# Patient Record
Sex: Female | Born: 1970 | Hispanic: Yes | Marital: Married | Smoking: Never smoker
Health system: Southern US, Community
[De-identification: ages and names within clinical notes are randomized; demographics above are authoritative.]

## PROBLEM LIST (undated history)

## (undated) DIAGNOSIS — D649 Anemia, unspecified: Secondary | ICD-10-CM

---

## 2005-04-28 ENCOUNTER — Emergency Department: Payer: Self-pay | Admitting: Emergency Medicine

## 2009-06-20 ENCOUNTER — Emergency Department: Payer: Self-pay | Admitting: Emergency Medicine

## 2011-12-13 ENCOUNTER — Emergency Department: Payer: Self-pay | Admitting: Emergency Medicine

## 2011-12-13 LAB — COMPREHENSIVE METABOLIC PANEL
Albumin: 4 g/dL (ref 3.4–5.0)
Alkaline Phosphatase: 110 U/L (ref 50–136)
Anion Gap: 12 (ref 7–16)
Calcium, Total: 8.5 mg/dL (ref 8.5–10.1)
Creatinine: 0.52 mg/dL — ABNORMAL LOW (ref 0.60–1.30)
Glucose: 101 mg/dL — ABNORMAL HIGH (ref 65–99)
SGOT(AST): 27 U/L (ref 15–37)
SGPT (ALT): 21 U/L (ref 12–78)
Total Protein: 8.4 g/dL — ABNORMAL HIGH (ref 6.4–8.2)

## 2011-12-13 LAB — CBC
HCT: 34.3 % — ABNORMAL LOW (ref 35.0–47.0)
MCH: 26.1 pg (ref 26.0–34.0)
MCHC: 33.1 g/dL (ref 32.0–36.0)
MCV: 79 fL — ABNORMAL LOW (ref 80–100)
Platelet: 406 10*3/uL (ref 150–440)
RDW: 16.7 % — ABNORMAL HIGH (ref 11.5–14.5)
WBC: 6.4 10*3/uL (ref 3.6–11.0)

## 2011-12-13 LAB — URINALYSIS, COMPLETE
Bacteria: NONE SEEN
Bilirubin,UR: NEGATIVE
Nitrite: NEGATIVE
Ph: 5 (ref 4.5–8.0)
Protein: NEGATIVE

## 2011-12-16 ENCOUNTER — Ambulatory Visit: Payer: Self-pay | Admitting: Internal Medicine

## 2013-12-12 ENCOUNTER — Ambulatory Visit: Payer: Self-pay | Admitting: Family Medicine
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
## 2016-01-16 ENCOUNTER — Other Ambulatory Visit: Payer: Self-pay | Admitting: Family Medicine

## 2016-01-16 DIAGNOSIS — Z1231 Encounter for screening mammogram for malignant neoplasm of breast: Secondary | ICD-10-CM

## 2016-01-21 ENCOUNTER — Ambulatory Visit
Admission: RE | Admit: 2016-01-21 | Discharge: 2016-01-21 | Disposition: A | Discharge status: Home / Self Care | Payer: Managed Care, Other (non HMO) | Source: Ambulatory Visit | Attending: Family Medicine | Admitting: Family Medicine

## 2016-01-21 ENCOUNTER — Encounter (INDEPENDENT_AMBULATORY_CARE_PROVIDER_SITE_OTHER): Payer: Self-pay

## 2016-01-21 DIAGNOSIS — Z1231 Encounter for screening mammogram for malignant neoplasm of breast: Secondary | ICD-10-CM | POA: Diagnosis not present

## 2016-04-10 IMAGING — MG MAM DGTL SCRN MAM NO ORDER W/CAD
5 series · 5 of 5 positions shown · non-contrast
Comparison: Previous exam(s).

CLINICAL DATA: Screening.

EXAM:
DIGITAL SCREENING BILATERAL MAMMOGRAM WITH CAD

[R XCCL]
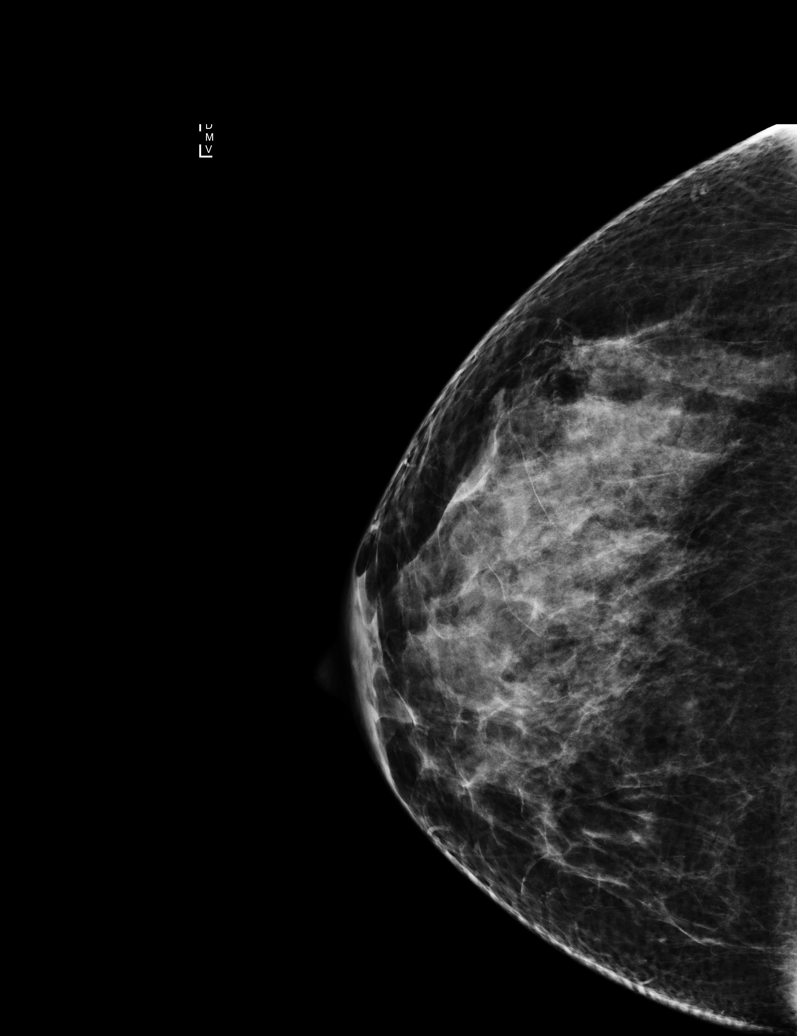

[L MLO]
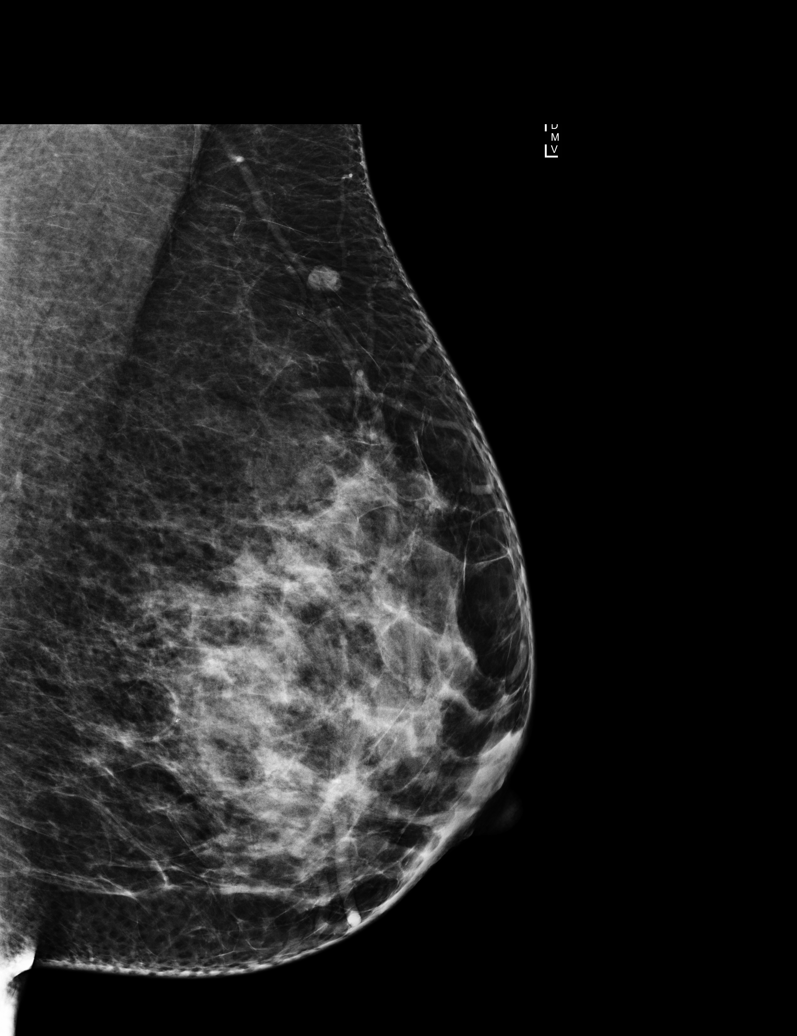

[L CC]
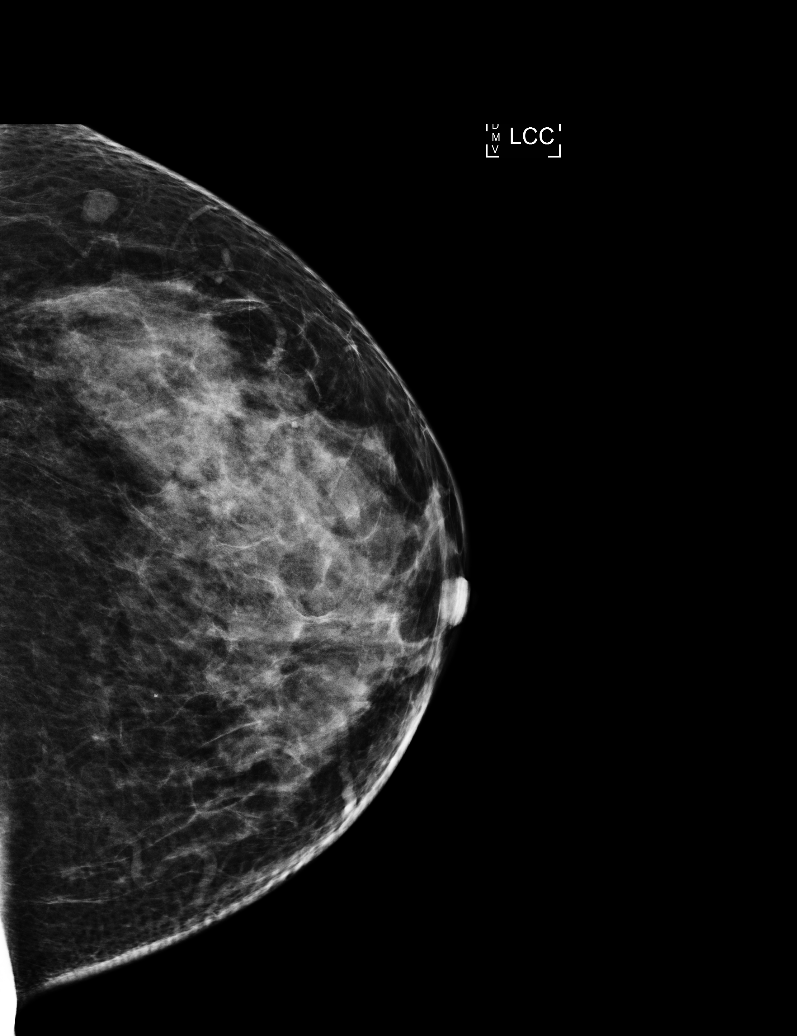

[R CC]
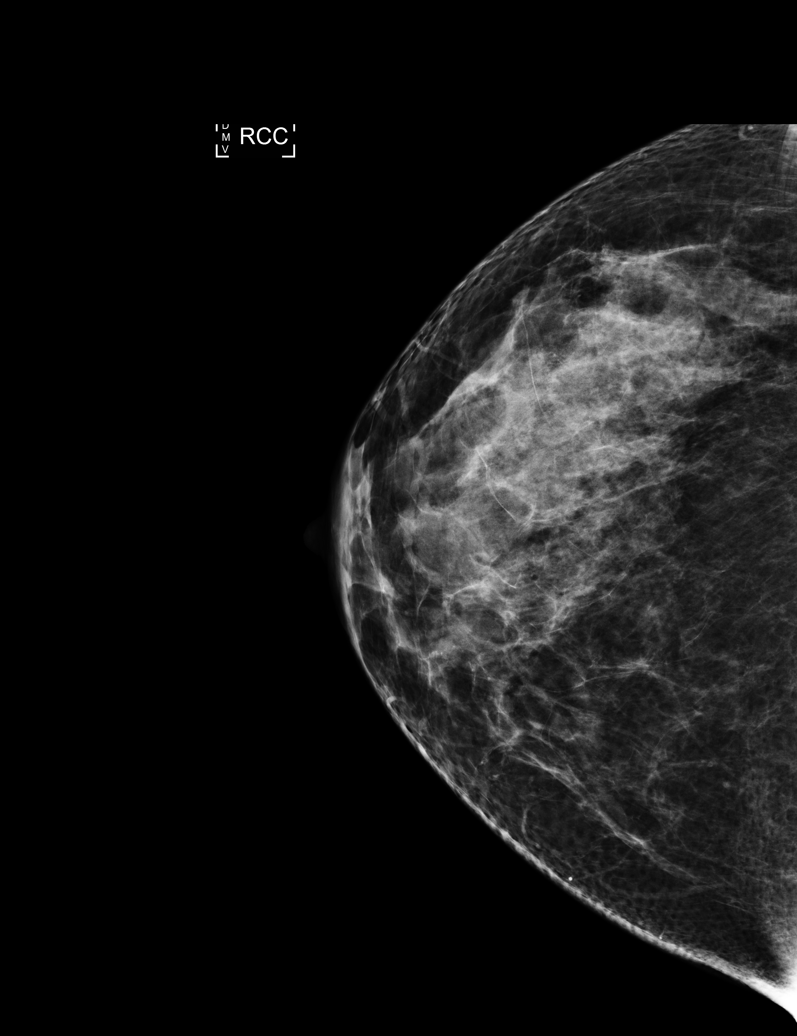

[R MLO]
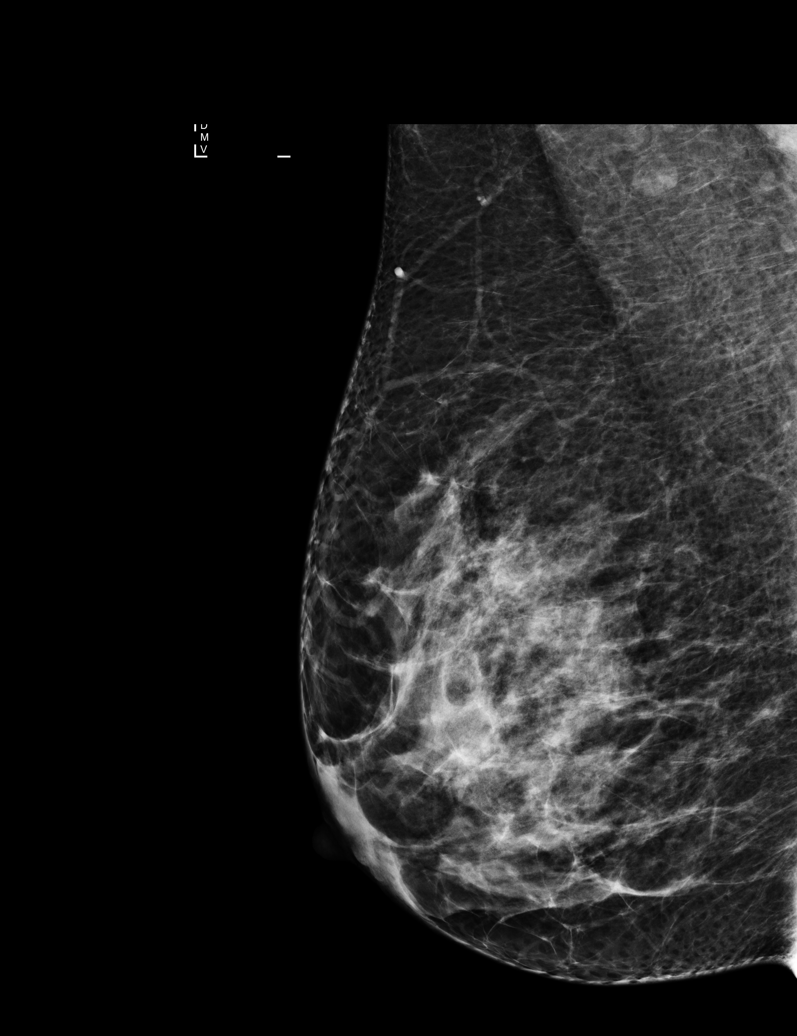

[5 of 5 positions shown; findings below may reference images not displayed]

ACR Breast Density Category c: The breast tissue is heterogeneously
dense, which may obscure small masses.
FINDINGS: There are no findings suspicious for malignancy. Images were
processed with CAD.
IMPRESSION: No mammographic evidence of malignancy. A result letter of this
screening mammogram will be mailed directly to the patient.

RECOMMENDATION:
Screening mammogram in one year. (Code:[id number])

BI-RADS CATEGORY  1: Negative.

## 2017-12-01 ENCOUNTER — Other Ambulatory Visit: Payer: Self-pay | Admitting: Family Medicine

## 2017-12-01 DIAGNOSIS — Z1231 Encounter for screening mammogram for malignant neoplasm of breast: Secondary | ICD-10-CM

## 2017-12-07 ENCOUNTER — Ambulatory Visit
Admission: RE | Admit: 2017-12-07 | Discharge: 2017-12-07 | Disposition: A | Discharge status: Home / Self Care | Payer: Commercial Managed Care - PPO | Source: Ambulatory Visit | Attending: Family Medicine | Admitting: Family Medicine

## 2017-12-07 DIAGNOSIS — Z1231 Encounter for screening mammogram for malignant neoplasm of breast: Secondary | ICD-10-CM | POA: Insufficient documentation

## 2018-10-03 ENCOUNTER — Other Ambulatory Visit: Payer: Self-pay

## 2018-10-03 DIAGNOSIS — Z20822 Contact with and (suspected) exposure to covid-19: Secondary | ICD-10-CM

## 2018-10-07 LAB — NOVEL CORONAVIRUS, NAA: SARS-CoV-2, NAA: DETECTED — AB

## 2019-05-13 ENCOUNTER — Ambulatory Visit: Payer: Commercial Managed Care - PPO | Attending: Internal Medicine

## 2019-05-13 DIAGNOSIS — Z23 Encounter for immunization: Secondary | ICD-10-CM

## 2019-05-13 NOTE — Progress Notes (Signed)
   Covid-19 Vaccination Clinic  Name:  Tracy Wright    MRN: [id number] DOB: May 14, 1970  05/13/2019  [name] was observed post Covid-19 immunization for 15 minutes without incidence. She was provided with Vaccine Information Sheet and instruction to access the V-Safe system.   [name] was instructed to call 911 with any severe reactions post vaccine: Marland Kitchen Difficulty breathing  . Swelling of your face and throat  . A fast heartbeat  . A bad rash all over your body  . Dizziness and weakness    Immunizations Administered    Name Date Dose VIS Date Route   Moderna COVID-19 Vaccine 05/13/2019  4:55 PM 0.5 mL 02/14/2019 Intramuscular   Manufacturer: Moderna   Lot: [id number]   NDC: 21194-174-08

## 2019-06-10 ENCOUNTER — Ambulatory Visit: Payer: Commercial Managed Care - PPO | Attending: Internal Medicine

## 2019-06-10 ENCOUNTER — Ambulatory Visit: Payer: Commercial Managed Care - PPO

## 2019-06-10 DIAGNOSIS — Z23 Encounter for immunization: Secondary | ICD-10-CM

## 2019-06-10 NOTE — Progress Notes (Signed)
   Covid-19 Vaccination Clinic  Name:  [name]    MRN: [id number] DOB: Mar 12, 1971  06/10/2019  [name] was observed post Covid-19 immunization for 15 minutes without incident. She was provided with Vaccine Information Sheet and instruction to access the V-Safe system.   [name] was instructed to call 911 with any severe reactions post vaccine: Marland Kitchen Difficulty breathing  . Swelling of face and throat  . A fast heartbeat  . A bad rash all over body  . Dizziness and weakness   Immunizations Administered    Name Date Dose VIS Date Route   Moderna COVID-19 Vaccine 06/10/2019  8:22 AM 0.5 mL 02/14/2019 Intramuscular   Manufacturer: Moderna   Lot: [id number]   NDC: 10272-536-64

## 2020-04-05 IMAGING — MG MM DIGITAL SCREENING BILAT W/ TOMO W/ CAD
8 series · 9 of 24 positions shown · non-contrast
Comparison: Previous exam(s).

CLINICAL DATA: Screening.

EXAM:
DIGITAL SCREENING BILATERAL MAMMOGRAM WITH TOMO AND CAD

[L CC synth-2D]
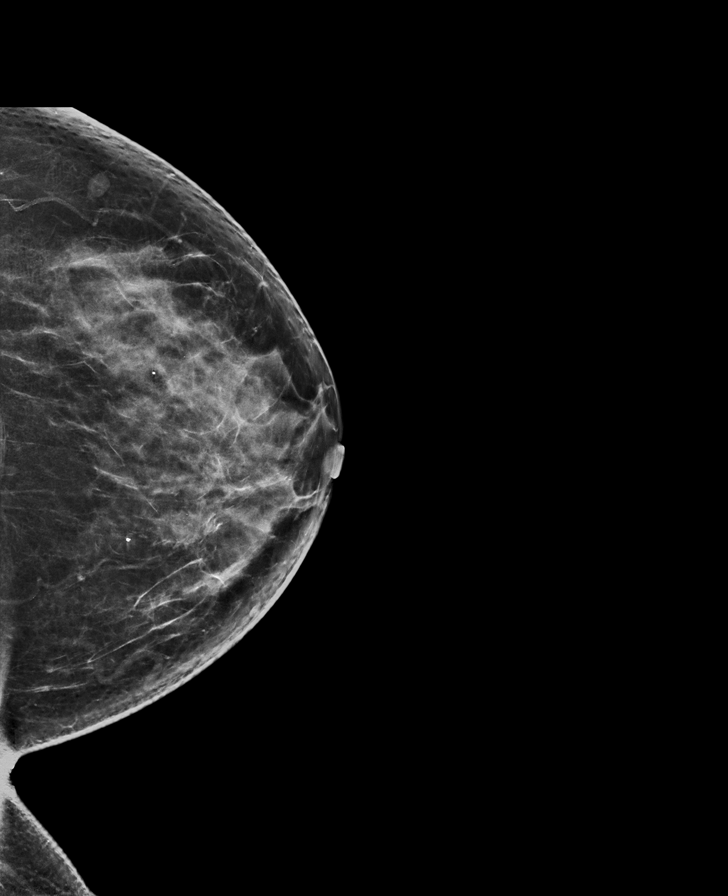

[R MLO synth-2D]
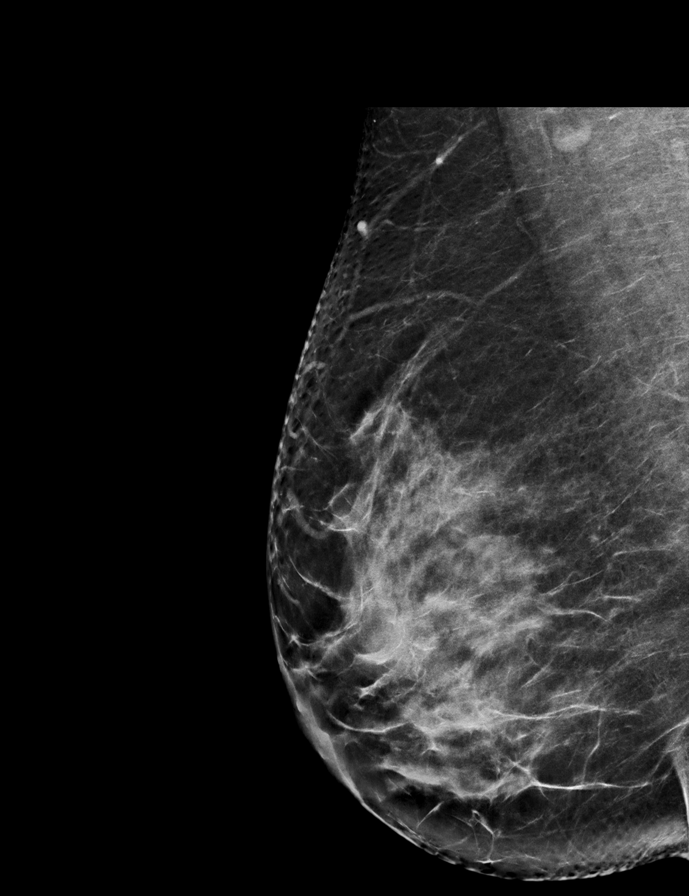

[R CC synth-2D]
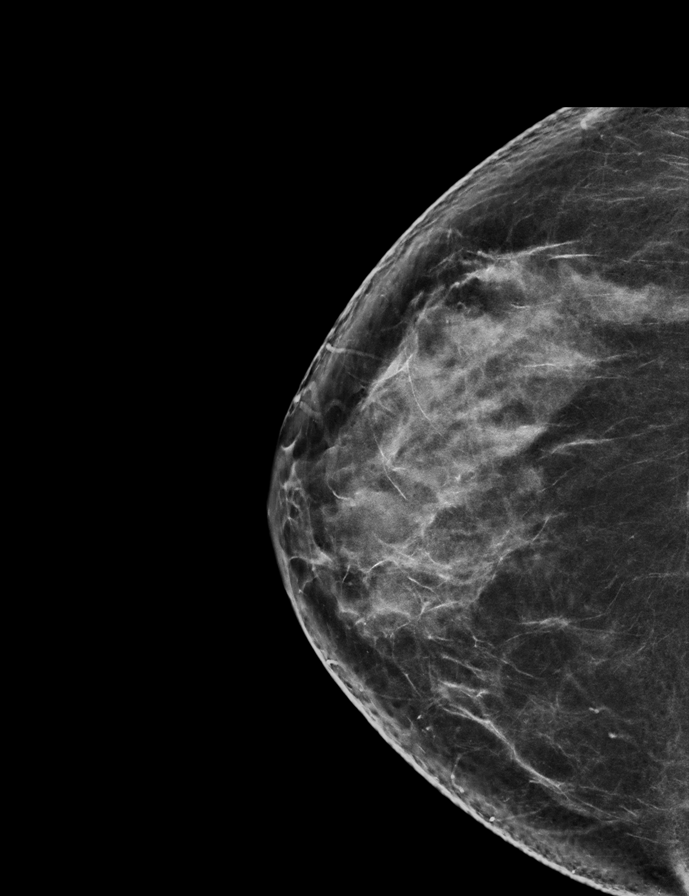

[L MLO synth-2D]
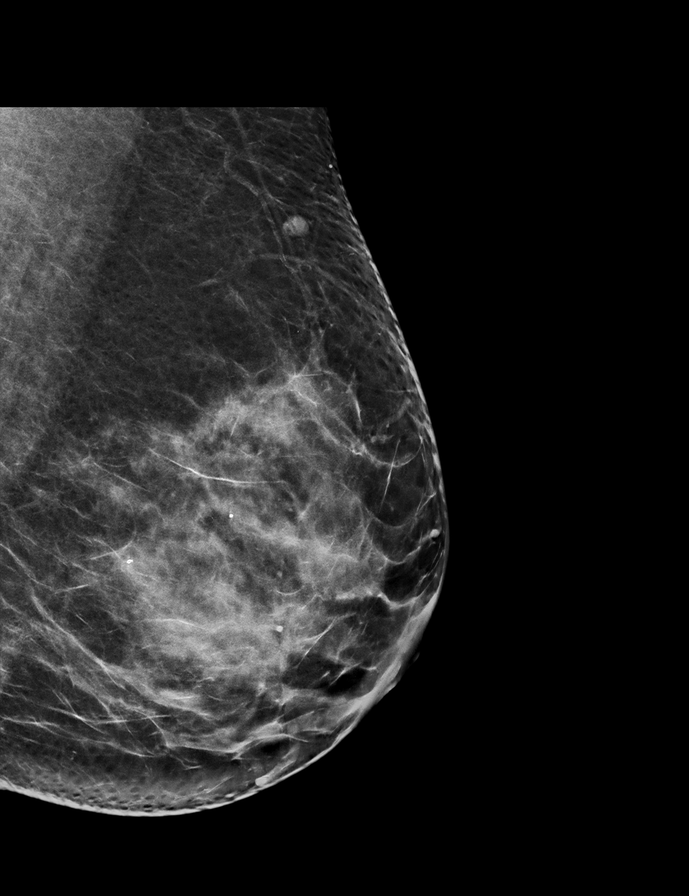

[R CC tomo · 2 of 78 frames shown]
[frame 26/78]
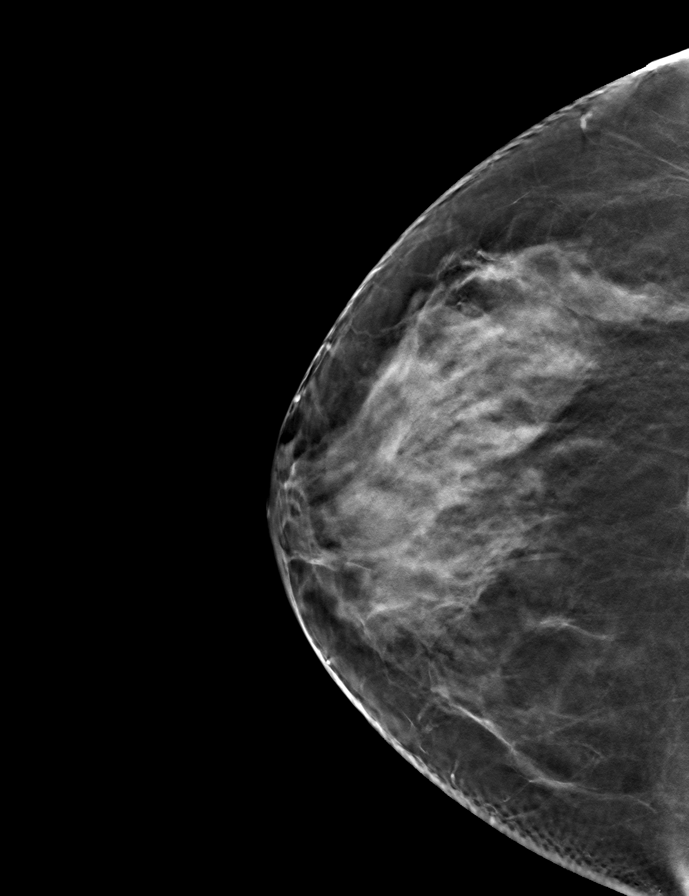
[frame 39/78]
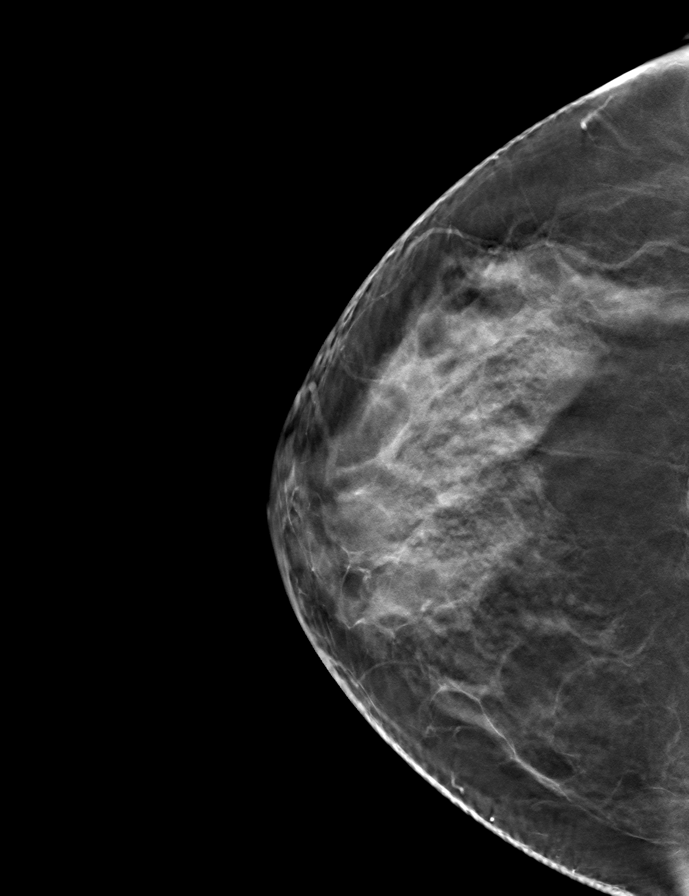

[L MLO tomo · tomo slice 40/79.0]
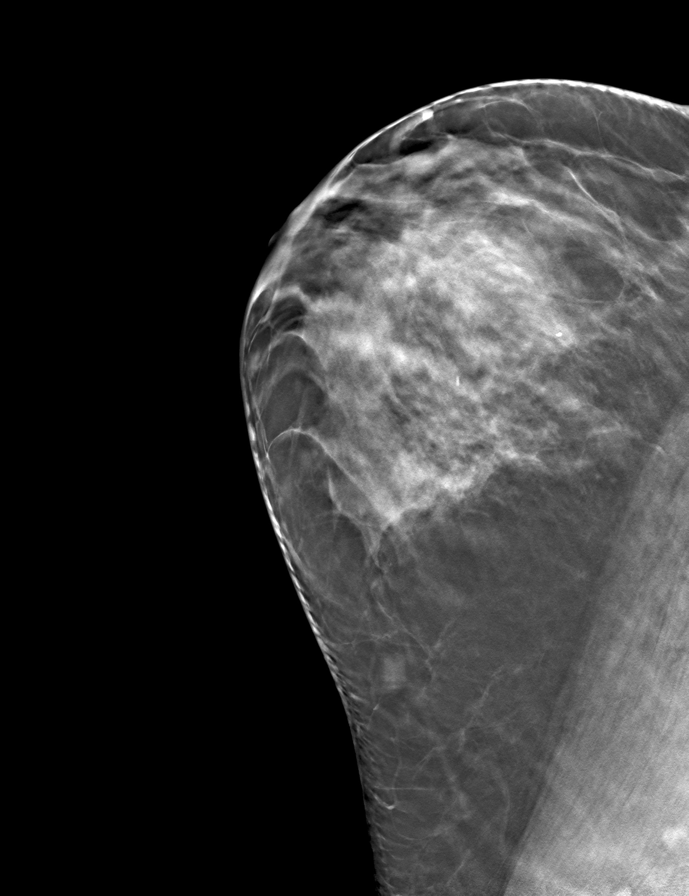

[R MLO tomo · tomo slice 42/83.0]
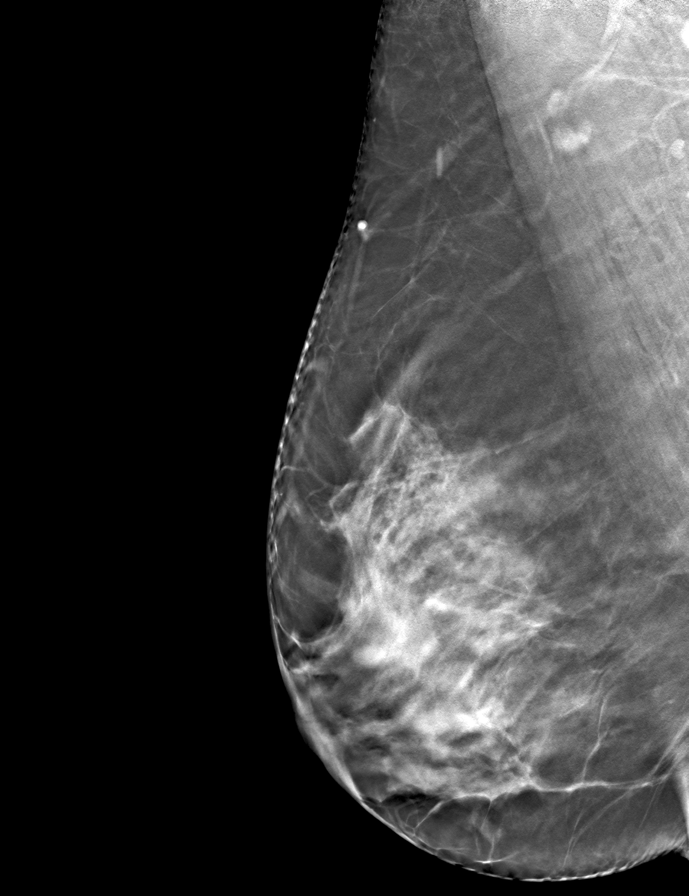

[L CC tomo · tomo slice 39/78.0]
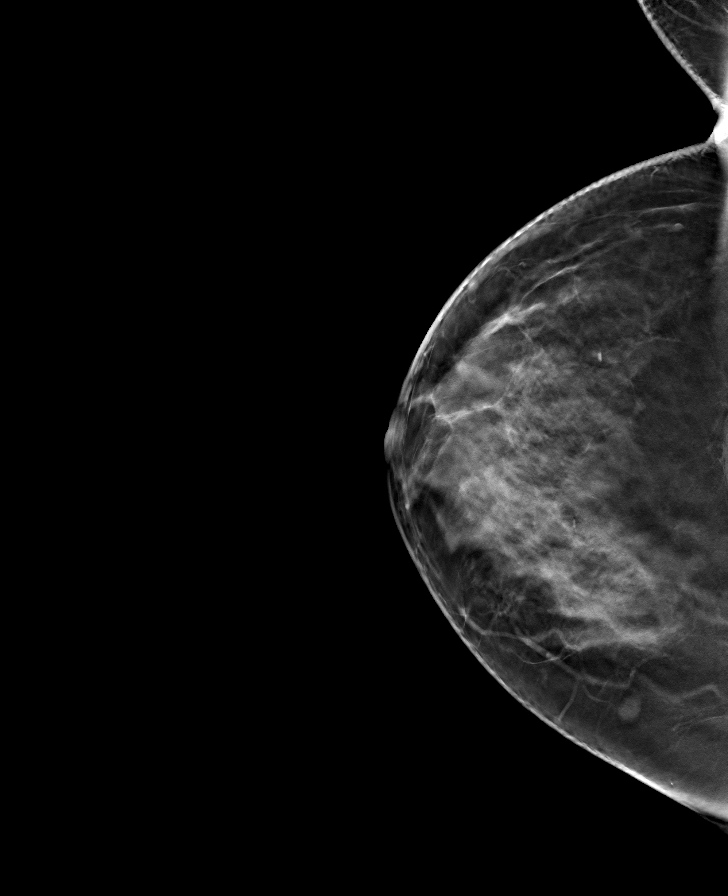

[9 of 24 positions shown; findings below may reference images not displayed]

ACR Breast Density Category c: The breast tissue is heterogeneously
dense, which may obscure small masses.
FINDINGS: There are no findings suspicious for malignancy. Images were
processed with CAD.
IMPRESSION: No mammographic evidence of malignancy. A result letter of this
screening mammogram will be mailed directly to the patient.

RECOMMENDATION:
Screening mammogram in one year. (Code:[id number])

BI-RADS CATEGORY  1: Negative.

## 2020-06-01 ENCOUNTER — Other Ambulatory Visit: Payer: Self-pay

## 2020-06-01 ENCOUNTER — Ambulatory Visit
Admission: RE | Admit: 2020-06-01 | Discharge: 2020-06-01 | Disposition: A | Discharge status: Home / Self Care | Payer: Commercial Managed Care - PPO | Source: Ambulatory Visit | Attending: Emergency Medicine | Admitting: Emergency Medicine

## 2020-06-01 VITALS — BP 147/73 | HR 68 | Temp 98.0°F | Resp 18 | Ht 60.0 in | Wt 143.0 lb

## 2020-06-01 DIAGNOSIS — N200 Calculus of kidney: Secondary | ICD-10-CM | POA: Diagnosis not present

## 2020-06-01 DIAGNOSIS — M549 Dorsalgia, unspecified: Secondary | ICD-10-CM

## 2020-06-01 DIAGNOSIS — R109 Unspecified abdominal pain: Secondary | ICD-10-CM | POA: Diagnosis not present

## 2020-06-01 DIAGNOSIS — N12 Tubulo-interstitial nephritis, not specified as acute or chronic: Secondary | ICD-10-CM

## 2020-06-01 LAB — URINALYSIS, COMPLETE (UACMP) WITH MICROSCOPIC
Bilirubin Urine: NEGATIVE
Glucose, UA: NEGATIVE mg/dL
Hgb urine dipstick: NEGATIVE
Ketones, ur: NEGATIVE mg/dL
Nitrite: NEGATIVE
Protein, ur: NEGATIVE mg/dL
Specific Gravity, Urine: 1.02 (ref 1.005–1.030)
pH: 7.5 (ref 5.0–8.0)

## 2020-06-01 MED ORDER — TAMSULOSIN HCL 0.4 MG PO CAPS: 0.4000 mg | ORAL_CAPSULE | Freq: Every day | ORAL | 1 refills | Status: AC

## 2020-06-01 MED ORDER — CIPROFLOXACIN HCL 500 MG PO TABS: 500.0000 mg | ORAL_TABLET | Freq: Two times a day (BID) | ORAL | 0 refills | Status: AC

## 2020-06-01 NOTE — ED Triage Notes (Signed)
Patient c/o left side abdominal pain that radiates into her back that started 2-3 weeks ago. Denies nausea, vomiting and diarrhea.

## 2020-06-01 NOTE — Discharge Instructions (Signed)
You were seen for abdominal and back pain and are being treated for kidney stones and kidney infection.   Take the antibiotics as prescribed until they're finished. If you think you're having a reaction, stop the medication, take benadryl and go to the nearest urgent care/emergency room. Take a probiotic while taking the antibiotic to decrease the chances of stomach upset.    Take care, Dr. Sharlet Salina, NP-c

## 2020-06-01 NOTE — ED Provider Notes (Signed)
[facility] - [place], [place]   Name: Tracy Wright DOB: 08/17/1970 MRN: [id number] CSN: [id number] PCP: Kandyce Rud, MD  Arrival date and time:  06/01/20 1313  Chief Complaint:  Back Pain and Abdominal Pain   NOTE: Prior to seeing the patient today, I have reviewed the triage nursing documentation and vital signs. Clinical staff has updated patient's PMH/PSHx, current medication list, and drug allergies/intolerances to ensure comprehensive history available to assist in medical decision making.   History:   HPI: Tracy Wright is a 50 y.o. female who presents today with complaints of radiating abdominal pain.  Patient states this pain started approximately 3 weeks ago, but over the past week it has radiated to her mid back.  She denies nausea, vomiting, fevers, or change in appetite.  She has noticed increase amount fatigue.  Patient states the last time she had pain was similar mild about 10 years ago she was diagnosed with a kidney stone.  The best of her knowledge, she never passed a stone.  She denies change to her urinary function: No increased frequency no dysuria, no changes in coloration.  No recent antibiotic use.   History reviewed. No pertinent past medical history.  History reviewed. No pertinent surgical history.  Family History  Problem Relation Age of Onset  . Diabetes Mother   . Cancer Father   . Diabetes Father   . Breast cancer Neg Hx     Social History   Tobacco Use  . Smoking status: Never Smoker  . Smokeless tobacco: Never Used  Vaping Use  . Vaping Use: Never used  Substance Use Topics  . Alcohol use: Never  . Drug use: Never    There are no problems to display for this patient.   Home Medications:    Current Meds  Medication Sig  . estradiol (ESTRACE) 1 MG tablet Take by mouth.    Allergies:   Patient has no known allergies.  Review of Systems (ROS): Review of Systems  Constitutional: Positive for fatigue.  Negative for appetite change and fever.  Gastrointestinal: Positive for abdominal pain. Negative for abdominal distention, nausea and vomiting.  Genitourinary: Positive for flank pain. Negative for difficulty urinating, dyspareunia, dysuria, frequency, pelvic pain and urgency.     Vital Signs: Today's Vitals   06/01/20 1333 06/01/20 1336  BP:  (!) 147/73  Pulse:  68  Resp:  18  Temp:  98 F (36.7 C)  TempSrc:  Oral  SpO2:  98%  Weight: 143 lb (64.9 kg)   Height: 5' (1.524 m)   PainSc: 8      Physical Exam: Physical Exam Vitals and nursing note reviewed.  Constitutional:      Appearance: Normal appearance.  Cardiovascular:     Rate and Rhythm: Normal rate and regular rhythm.     Pulses: Normal pulses.     Heart sounds: Normal heart sounds.  Pulmonary:     Effort: Pulmonary effort is normal.     Breath sounds: Normal breath sounds.  Abdominal:     General: Abdomen is flat. Bowel sounds are normal.     Palpations: Abdomen is soft.     Tenderness: There is abdominal tenderness in the left lower quadrant. There is left CVA tenderness. There is no right CVA tenderness or guarding.     Hernia: No hernia is present.  Skin:    General: Skin is warm and dry.  Neurological:     General: No focal deficit present.  Mental Status: She is alert and oriented to person, place, and time.  Psychiatric:        Mood and Affect: Mood normal.        Behavior: Behavior normal.      Urgent Care Treatments / Results:   LABS: PLEASE NOTE: all labs that were ordered this encounter are listed, however only abnormal results are displayed. Labs Reviewed  URINALYSIS, COMPLETE (UACMP) WITH MICROSCOPIC - Abnormal; Notable for the following components:      Result Value   APPearance CLOUDY (*)    Leukocytes,Ua SMALL (*)    Bacteria, UA RARE (*)    All other components within normal limits    EKG: -None  RADIOLOGY: No results found.  PROCEDURES: Procedures  MEDICATIONS RECEIVED  THIS VISIT: Medications - No data to display  PERTINENT CLINICAL COURSE NOTES/UPDATES:   Initial Impression / Assessment and Plan / Urgent Care Course:  Pertinent labs & imaging results that were available during my care of the patient were personally reviewed by me and considered in my medical decision making (see lab/imaging section of note for values and interpretations).  [name] is a 50 y.o. female who presents to [facility] today with complaints of abdominal and back pain, diagnosed with probable kidney stone and secondary pyelonephritis, and treated as such with the medications below. NP and patient reviewed discharge instructions below during visit.   Patient is well appearing overall in clinic today. She does not appear to be in any acute distress. Presenting symptoms (see HPI) and exam as documented above.   I have reviewed the follow up and strict return precautions for any new or worsening symptoms. Patient is aware of symptoms that would be deemed urgent/emergent, and would thus require further evaluation either here or in the emergency department. At the time of discharge, she verbalized understanding and consent with the discharge plan as it was reviewed with her. All questions were fielded by provider and/or clinic staff prior to patient discharge.    Final Clinical Impressions / Urgent Care Diagnoses:   Final diagnoses:  Kidney stone  Pyelonephritis    New Prescriptions:   Controlled Substance Registry consulted? Not Applicable  Meds ordered this encounter  Medications  . ciprofloxacin (CIPRO) 500 MG tablet    Sig: Take 1 tablet (500 mg total) by mouth 2 (two) times daily.    Dispense:  14 tablet    Refill:  0  . tamsulosin (FLOMAX) 0.4 MG CAPS capsule    Sig: Take 1 capsule (0.4 mg total) by mouth daily.    Dispense:  14 capsule    Refill:  1      Discharge Instructions     You were seen for abdominal and back pain and are being treated  for kidney stones and kidney infection.   Take the antibiotics as prescribed until they're finished. If you think you're having a reaction, stop the medication, take benadryl and go to the nearest urgent care/emergency room. Take a probiotic while taking the antibiotic to decrease the chances of stomach upset.    Take care, Dr. Sharlet Salina, NP-c      Recommended Follow up Care:  Patient encouraged to follow up with the following provider within the specified time frame, or sooner as dictated by the severity of her symptoms. As always, she was instructed that for any urgent/emergent care needs, she should seek care either here or in the emergency department for more immediate evaluation.   Bailey Mech, DNP, NP-c  Bailey Mech, NP 06/01/20 1434

## 2020-06-03 LAB — URINE CULTURE

## 2021-03-14 ENCOUNTER — Other Ambulatory Visit: Payer: Self-pay | Admitting: Family Medicine

## 2021-03-14 DIAGNOSIS — Z1231 Encounter for screening mammogram for malignant neoplasm of breast: Secondary | ICD-10-CM

## 2021-05-05 ENCOUNTER — Ambulatory Visit
Admission: RE | Admit: 2021-05-05 | Discharge: 2021-05-05 | Disposition: A | Discharge status: Home / Self Care | Payer: Commercial Managed Care - PPO | Source: Ambulatory Visit | Attending: Family Medicine | Admitting: Family Medicine

## 2021-05-05 ENCOUNTER — Other Ambulatory Visit: Payer: Self-pay

## 2021-05-05 DIAGNOSIS — Z1231 Encounter for screening mammogram for malignant neoplasm of breast: Secondary | ICD-10-CM | POA: Diagnosis not present

## 2021-09-10 ENCOUNTER — Emergency Department
Admission: EM | Admit: 2021-09-10 | Discharge: 2021-09-10 | Disposition: A | Discharge status: Home / Self Care | Payer: Commercial Managed Care - PPO | Attending: Emergency Medicine | Admitting: Emergency Medicine

## 2021-09-10 ENCOUNTER — Emergency Department: Payer: Commercial Managed Care - PPO

## 2021-09-10 ENCOUNTER — Other Ambulatory Visit: Payer: Self-pay

## 2021-09-10 DIAGNOSIS — R519 Headache, unspecified: Secondary | ICD-10-CM | POA: Insufficient documentation

## 2021-09-10 DIAGNOSIS — H538 Other visual disturbances: Secondary | ICD-10-CM | POA: Diagnosis not present

## 2021-09-10 LAB — CBC WITH DIFFERENTIAL/PLATELET
Abs Immature Granulocytes: 0.02 10*3/uL (ref 0.00–0.07)
Basophils Absolute: 0 10*3/uL (ref 0.0–0.1)
Basophils Relative: 0 %
Eosinophils Absolute: 0 10*3/uL (ref 0.0–0.5)
Eosinophils Relative: 1 %
HCT: 38.4 % (ref 36.0–46.0)
Hemoglobin: 12.7 g/dL (ref 12.0–15.0)
Immature Granulocytes: 0 %
Lymphocytes Relative: 32 %
Lymphs Abs: 2.6 10*3/uL (ref 0.7–4.0)
MCH: 29.3 pg (ref 26.0–34.0)
MCHC: 33.1 g/dL (ref 30.0–36.0)
MCV: 88.5 fL (ref 80.0–100.0)
Monocytes Absolute: 0.5 10*3/uL (ref 0.1–1.0)
Monocytes Relative: 6 %
Neutro Abs: 5 10*3/uL (ref 1.7–7.7)
Neutrophils Relative %: 61 %
Platelets: 378 10*3/uL (ref 150–400)
RBC: 4.34 MIL/uL (ref 3.87–5.11)
RDW: 12.3 % (ref 11.5–15.5)
WBC: 8.2 10*3/uL (ref 4.0–10.5)
nRBC: 0 % (ref 0.0–0.2)

## 2021-09-10 LAB — BASIC METABOLIC PANEL
Anion gap: 8 (ref 5–15)
BUN: 13 mg/dL (ref 6–20)
CO2: 26 mmol/L (ref 22–32)
Calcium: 9.3 mg/dL (ref 8.9–10.3)
Chloride: 105 mmol/L (ref 98–111)
Creatinine, Ser: 0.58 mg/dL (ref 0.44–1.00)
GFR, Estimated: >60 mL/min (ref >60)
Glucose, Bld: 115 mg/dL — ABNORMAL HIGH (ref 70–99)
Potassium: 3.6 mmol/L (ref 3.5–5.1)
Sodium: 139 mmol/L (ref 135–145)

## 2021-09-10 MED ORDER — LACTATED RINGERS IV BOLUS
1000.0000 mL | Freq: Once | INTRAVENOUS | Status: AC
  Administered 2021-09-10: 1000 mL via INTRAVENOUS

## 2021-09-10 MED ORDER — PROCHLORPERAZINE EDISYLATE 10 MG/2ML IJ SOLN
10.0000 mg | Freq: Once | INTRAMUSCULAR | Status: AC
  Administered 2021-09-10: 10 mg via INTRAVENOUS
  Filled 2021-09-10: qty 2

## 2021-09-10 MED ORDER — DIPHENHYDRAMINE HCL 50 MG/ML IJ SOLN
25.0000 mg | Freq: Once | INTRAMUSCULAR | Status: AC
  Administered 2021-09-10: 25 mg via INTRAVENOUS
  Filled 2021-09-10: qty 1

## 2021-09-10 MED ORDER — PROCHLORPERAZINE MALEATE 10 MG PO TABS: 10.0000 mg | ORAL_TABLET | Freq: Four times a day (QID) | ORAL | 0 refills | Status: AC | PRN

## 2021-09-10 NOTE — ED Triage Notes (Signed)
Pt arrives with c/o headache that started about 2 weeks ago. Per pt, headache got worse today and she started to have some blurry vision about 2 hours go. Pt endorses some generalized weakness. Pt denies slurred speech, facial droop, or gait disturbance. Pt denies hx of headache/migraines.

## 2021-09-10 NOTE — ED Provider Notes (Signed)
[facility] Provider Note    Event Date/Time   First MD Initiated Contact with Patient 09/10/21 1745     (approximate)   History   Chief Complaint Headache   HPI  Tracy Wright is a 51 y.o. female with no significant past medical history who presents to the ED complaining of headache.  Majority of history is obtained from patient's daughter as patient prefers to have her translate.  Daughter states that patient has been dealing with constant waxing and waning headache for about the past 2 weeks.  She describes it as a throbbing that primarily affects the right side of her head, has not been exacerbated or alleviated by anything in particular.  She developed some blurry vision in both eyes affecting her entire visual field earlier today.  Now states that visual symptoms have resolved and she has not had any speech changes, numbness, or weakness in her extremities.  She denies any history of migraines or similar headaches in the past.  She was referred to the ED for further evaluation after presenting at the [facility] walk-in clinic.     Physical Exam   Triage Vital Signs: ED Triage Vitals  Enc Vitals Group     BP 09/10/21 1719 137/79     Pulse Rate 09/10/21 1719 72     Resp 09/10/21 1719 16     Temp 09/10/21 1719 98.3 F (36.8 C)     Temp Source 09/10/21 1719 Oral     SpO2 09/10/21 1719 96 %     Weight 09/10/21 1720 146 lb (66.2 kg)     Height 09/10/21 1720 5' (1.524 m)     Head Circumference --      Peak Flow --      Pain Score 09/10/21 1726 9     Pain Loc --      Pain Edu? --      Excl. in GC? --     Most recent vital signs: Vitals:   09/10/21 2034 09/10/21 2154  BP: 133/69 (!) 158/94  Pulse: 78 69  Resp: 18 18  Temp:  (!) 97.5 F (36.4 C)  SpO2: 100% 99%    Constitutional: Alert and oriented. Eyes: Conjunctivae are normal. Head: Atraumatic.  No temporal tenderness bilaterally. Nose: No congestion/rhinnorhea. Mouth/Throat: Mucous  membranes are moist.  Neck: Supple with no meningismus. Cardiovascular: Normal rate, regular rhythm. Grossly normal heart sounds.  2+ radial pulses bilaterally. Respiratory: Normal respiratory effort.  No retractions. Lungs CTAB. Gastrointestinal: Soft and nontender. No distention. Musculoskeletal: No lower extremity tenderness nor edema.  Neurologic:  Normal speech and language. No gross focal neurologic deficits are appreciated.    ED Results / Procedures / Treatments   Labs (all labs ordered are listed, but only abnormal results are displayed) Labs Reviewed  BASIC METABOLIC PANEL - Abnormal; Notable for the following components:      Result Value   Glucose, Bld 115 (*)    All other components within normal limits  CBC WITH DIFFERENTIAL/PLATELET  POC URINE PREG, ED   RADIOLOGY CT head reviewed and interpreted by me with no hemorrhage or midline shift.  PROCEDURES:  Critical Care performed: No  Procedures   MEDICATIONS ORDERED IN ED: Medications  prochlorperazine (COMPAZINE) injection 10 mg (10 mg Intravenous Given 09/10/21 2018)  diphenhydrAMINE (BENADRYL) injection 25 mg (25 mg Intravenous Given 09/10/21 2023)  lactated ringers bolus 1,000 mL (0 mLs Intravenous Stopped 09/10/21 2148)     IMPRESSION / MDM / ASSESSMENT AND  PLAN / ED COURSE  I reviewed the triage vital signs and the nursing notes.                              51 y.o. female with no significant past medical history presents to the ED with waxing and waning headache that has been gradually worsening over the past 2 weeks associated with some visual changes earlier today.  Patient's presentation is most consistent with acute presentation with potential threat to life or bodily function.  Differential diagnosis includes, but is not limited to, stroke, TIA, SAH, meningitis, tension headache, migraine headache, temporal arteritis.  Patient well-appearing and in no acute distress, vital signs are unremarkable  and she has no focal neurologic deficits on exam.  She reports some visual changes earlier but these have since resolved and no temporal tenderness to suggest temporal arteritis.  Labs are reassuring with no significant anemia, leukocytosis, AKI, or electrolyte abnormality.  CT head is negative for acute process.  Symptoms seem most consistent with migraine headache versus tension headache.  She was given migraine cocktail with improvement in symptoms and is appropriate for discharge home with PCP follow-up.  She was counseled to return to the ED for new worsening symptoms, patient agrees with plan.      FINAL CLINICAL IMPRESSION(S) / ED DIAGNOSES   Final diagnoses:  Acute nonintractable headache, unspecified headache type     Rx / DC Orders   ED Discharge Orders          Ordered    prochlorperazine (COMPAZINE) 10 MG tablet  Every 6 hours PRN        09/10/21 2143             Note:  This document was prepared using Dragon voice recognition software and may include unintentional dictation errors.   Chesley Noon, MD 09/10/21 2328

## 2021-09-10 NOTE — ED Notes (Signed)
Pt ambulating without diff to bed from ct  pt sitting up on stretcher talking with family, smiling, laughing.  No acute distress.

## 2022-01-14 ENCOUNTER — Encounter: Payer: Self-pay | Admitting: Emergency Medicine

## 2022-01-14 ENCOUNTER — Ambulatory Visit
Admission: EM | Admit: 2022-01-14 | Discharge: 2022-01-14 | Disposition: A | Discharge status: Home / Self Care | Payer: Commercial Managed Care - PPO | Attending: Family Medicine | Admitting: Family Medicine

## 2022-01-14 ENCOUNTER — Ambulatory Visit (INDEPENDENT_AMBULATORY_CARE_PROVIDER_SITE_OTHER): Payer: Commercial Managed Care - PPO

## 2022-01-14 DIAGNOSIS — M79642 Pain in left hand: Secondary | ICD-10-CM

## 2022-01-14 MED ORDER — NAPROXEN 500 MG PO TABS: 500.0000 mg | ORAL_TABLET | Freq: Two times a day (BID) | ORAL | 0 refills | Status: AC

## 2022-01-14 MED ORDER — CEPHALEXIN 500 MG PO CAPS: 500.0000 mg | ORAL_CAPSULE | Freq: Three times a day (TID) | ORAL | 0 refills | Status: AC

## 2022-01-14 MED ORDER — TRIAMCINOLONE ACETONIDE 0.1 % EX OINT: 1.0000 | TOPICAL_OINTMENT | Freq: Three times a day (TID) | CUTANEOUS | 0 refills | Status: AC

## 2022-01-14 NOTE — ED Triage Notes (Signed)
Pt c/o left hand swelling and pain. Started yesterday. Denies injury.

## 2022-01-14 NOTE — Discharge Instructions (Addendum)
Pase por la farmacia para recoger sus recetas. Tome los antibiticos/Keflex 3 veces [address]. Aplique la pomada con esteroides triamcinolona 3 veces al da durante la prxima semana. Puede tomar Naprosyn dos veces al da con alimentos para Conservation officer, historic buildings.  Stop by the pharmacy to pick up your prescriptions.   Take the antibiotics/Keflex 3 times a day for 7 days.  Apply the steroid ointment triamcinolone 3 times a day for the next week.  You can take Naprosyn twice a day with food for pain.

## 2022-01-14 NOTE — ED Provider Notes (Signed)
[facility]    CSN: [id number] Arrival date & time: 01/14/22  1840      History   Chief Complaint Chief Complaint  Patient presents with   hand swelling    left    HPI  HPI Tracy Wright is a 51 y.o. female.  Patient's husband serves as her Spanish interpreter.  Evangelinap presents for left hand pain after waking up yesterday morning. The pain and swelling has worsened today. Pain described as "itching and stinging at the same time."  No history of gout or autoimmune disease. No fevers. No known insect bite.  No known injury.  She cleans homes for living.  She had no problems with her hand prior to yesterday.  She feels like she cannot move her last 2 fingers on her left hand.  The hand is swollen and warm.  Denies fever, numbness or tingling.     History reviewed. No pertinent past medical history.  There are no problems to display for this patient.   History reviewed. No pertinent surgical history.  OB History   No obstetric history on file.      Home Medications    Prior to Admission medications   Medication Sig Start Date End Date Taking? Authorizing Provider  cephALEXin (KEFLEX) 500 MG capsule Take 1 capsule (500 mg total) by mouth 3 (three) times daily for 7 days. 01/14/22 01/21/22 Yes Jontavia Leatherbury, DO  estradiol (ESTRACE) 1 MG tablet Take by mouth. 11/30/19  Yes [provider]  naproxen (NAPROSYN) 500 MG tablet Take 1 tablet (500 mg total) by mouth 2 (two) times daily. 01/14/22  Yes Ineze Serrao, DO  triamcinolone ointment (KENALOG) 0.1 % Apply 1 Application topically 3 (three) times daily for 7 days. 01/14/22 01/21/22 Yes Eddi Hymes, DO  ciprofloxacin (CIPRO) 500 MG tablet Take 1 tablet (500 mg total) by mouth 2 (two) times daily. 06/01/20   Bailey Mech, NP  prochlorperazine (COMPAZINE) 10 MG tablet Take 1 tablet (10 mg total) by mouth every 6 (six) hours as needed for nausea or vomiting (Headache). 09/10/21   Chesley Noon, MD  tamsulosin (FLOMAX) 0.4 MG CAPS capsule Take 1 capsule (0.4 mg total) by mouth daily. 06/01/20   Bailey Mech, NP    Family History Family History  Problem Relation Age of Onset   Diabetes Mother    Cancer Father    Diabetes Father    Breast cancer Neg Hx     Social History Social History   Tobacco Use   Smoking status: Never   Smokeless tobacco: Never  Vaping Use   Vaping Use: Never used  Substance Use Topics   Alcohol use: Never   Drug use: Never     Allergies   Patient has no known allergies.   Review of Systems Review of Systems: :negative unless otherwise stated in HPI.      Physical Exam Triage Vital Signs ED Triage Vitals  Enc Vitals Group     BP 01/14/22 1932 (!) 139/98     Pulse Rate 01/14/22 1932 70     Resp 01/14/22 1932 16     Temp 01/14/22 1932 98.2 F (36.8 C)     Temp Source 01/14/22 1932 Oral     SpO2 01/14/22 1932 100 %     Weight 01/14/22 1930 145 lb 15.1 oz (66.2 kg)     Height 01/14/22 1930 5' (1.524 m)     Head Circumference --      Peak Flow --  Pain Score 01/14/22 1930 8     Pain Loc --      Pain Edu? --      Excl. in [place]? --    No data found.  Updated Vital Signs BP (!) 139/98 (BP Location: Right Arm)   Pulse 70   Temp 98.2 F (36.8 C) (Oral)   Resp 16   Ht 5' (1.524 m)   Wt 66.2 kg   SpO2 100%   BMI 28.50 kg/m   Visual Acuity Right Eye Distance:   Left Eye Distance:   Bilateral Distance:    Right Eye Near:   Left Eye Near:    Bilateral Near:     Physical Exam GEN: well appearing female in no acute distress  CVS: well perfused  RESP: speaking in full sentences without pause, no respiratory distress  MSK:  Left Hand: Inspection: No obvious deformity b/l. + swelling, +erythema, no bruising Palpation: left 4th and 5th metacarpal tenderness along the shaft ROM: Full ROM of the digits and wrist b/l. Fully able to extend and flex finger. Strength: 5/5 strength in the wrist and interosseus  muscles Neurovascular: NV intact b/l Special tests: Negative finkelstein's, negative tinel's at the carpal tunnel, negative Phalen's   Fingers:  No swelling in PIP, DIP joints b/l. Flexor digitorum profundus and superficialis tendon functions are intact.  PIP joint collateral ligaments are stable     UC Treatments / Results  Labs (all labs ordered are listed, but only abnormal results are displayed) Labs Reviewed - No data to display  EKG   Radiology DG Hand Complete Left  Result Date: 01/14/2022 CLINICAL DATA:  Pain and swelling EXAM: LEFT HAND - COMPLETE 3+ VIEW COMPARISON:  None Available. FINDINGS: There is no evidence of fracture or dislocation. There is no evidence of arthropathy or other focal bone abnormality. Soft tissues are unremarkable. IMPRESSION: No radiographic abnormality is seen in left hand. Electronically Signed   By: Elmer Picker M.D.   On: 01/14/2022 20:04    Procedures Procedures (including critical care time)  Medications Ordered in UC Medications - No data to display  Initial Impression / Assessment and Plan / UC Course  I have reviewed the triage vital signs and the nursing notes.  Pertinent labs & imaging results that were available during my care of the patient were reviewed by me and considered in my medical decision making (see chart for details).        Pt is a 51 y.o.  female with 2 days of left hand pain without known injury.  Given swelling and warmth on exam obtained left hand x-rays that were unremarkable for acute fracture, dislocated for carpals/metacarpals.  No known bug bites but this could be an insect bite or contact dermatitis.  It is warm enough to be cellulitic.  Given steroid ointment and antibiotics.  Patient to gradually return to normal activities, as tolerated and continue ordinary activities within the limits permitted by pain. Prescribed Naproxen sodium and wrapped in Ace wrap for pain relief.  Tylenol PRN. Advised  patient to avoid other NSAIDs while taking Naprosyn. Counseled patient on red flag symptoms and when to seek immediate care.   Patient to return or follow up with orthopedic provider, if symptoms do not improve with conservative treatment.  Return and ED precautions given.   Discussed MDM, treatment plan and plan for follow-up with patient/parent who agrees with plan.   Final Clinical Impressions(s) / UC Diagnoses   Final diagnoses:  Left hand pain  Discharge Instructions      Pase por la farmacia para recoger sus recetas. Tome los antibiticos/Keflex 3 veces al da durante 7 [address]. Aplique la pomada con esteroides triamcinolona 3 veces al da durante la prxima semana. Puede tomar Naprosyn dos veces al da con alimentos para Chief Technology Officer.  Stop by the pharmacy to pick up your prescriptions.   Take the antibiotics/Keflex 3 times a day for 7 days.  Apply the steroid ointment triamcinolone 3 times a day for the next week.  You can take Naprosyn twice a day with food for pain.     ED Prescriptions     Medication Sig Dispense Auth. Provider   triamcinolone ointment (KENALOG) 0.1 % Apply 1 Application topically 3 (three) times daily for 7 days. 21 g Brewer Hitchman, DO   cephALEXin (KEFLEX) 500 MG capsule Take 1 capsule (500 mg total) by mouth 3 (three) times daily for 7 days. 21 capsule Ciara Kagan, DO   naproxen (NAPROSYN) 500 MG tablet Take 1 tablet (500 mg total) by mouth 2 (two) times daily. 30 tablet Katha Cabal, DO      PDMP not reviewed this encounter.   Katha Cabal, DO 01/14/22 2052

## 2022-04-30 ENCOUNTER — Other Ambulatory Visit: Payer: Self-pay

## 2022-04-30 DIAGNOSIS — Z1231 Encounter for screening mammogram for malignant neoplasm of breast: Secondary | ICD-10-CM

## 2022-05-12 ENCOUNTER — Ambulatory Visit
Admission: RE | Admit: 2022-05-12 | Discharge: 2022-05-12 | Disposition: A | Discharge status: Home / Self Care | Payer: 59 | Source: Ambulatory Visit | Attending: Family Medicine | Admitting: Family Medicine

## 2022-05-12 DIAGNOSIS — Z1231 Encounter for screening mammogram for malignant neoplasm of breast: Secondary | ICD-10-CM | POA: Insufficient documentation

## 2023-06-15 ENCOUNTER — Other Ambulatory Visit: Payer: Self-pay | Admitting: Family Medicine

## 2023-06-15 DIAGNOSIS — Z1231 Encounter for screening mammogram for malignant neoplasm of breast: Secondary | ICD-10-CM

## 2023-06-29 ENCOUNTER — Ambulatory Visit
Admission: RE | Admit: 2023-06-29 | Discharge: 2023-06-29 | Disposition: A | Discharge status: Home / Self Care | Payer: Self-pay | Source: Ambulatory Visit | Attending: Family Medicine | Admitting: Family Medicine

## 2023-06-29 DIAGNOSIS — Z1231 Encounter for screening mammogram for malignant neoplasm of breast: Secondary | ICD-10-CM | POA: Insufficient documentation

## 2023-08-02 ENCOUNTER — Ambulatory Visit: Payer: Self-pay

## 2023-09-02 IMAGING — MG MM DIGITAL SCREENING BILAT W/ TOMO AND CAD
8 series · 8 of 24 positions shown · non-contrast
Comparison: Previous exam(s).

CLINICAL DATA: Screening.

EXAM:
DIGITAL SCREENING BILATERAL MAMMOGRAM WITH TOMOSYNTHESIS AND CAD
TECHNIQUE: Bilateral screening digital craniocaudal and mediolateral oblique
mammograms were obtained. Bilateral screening digital breast
tomosynthesis was performed. The images were evaluated with
computer-aided detection.

[R MLO synth-2D]
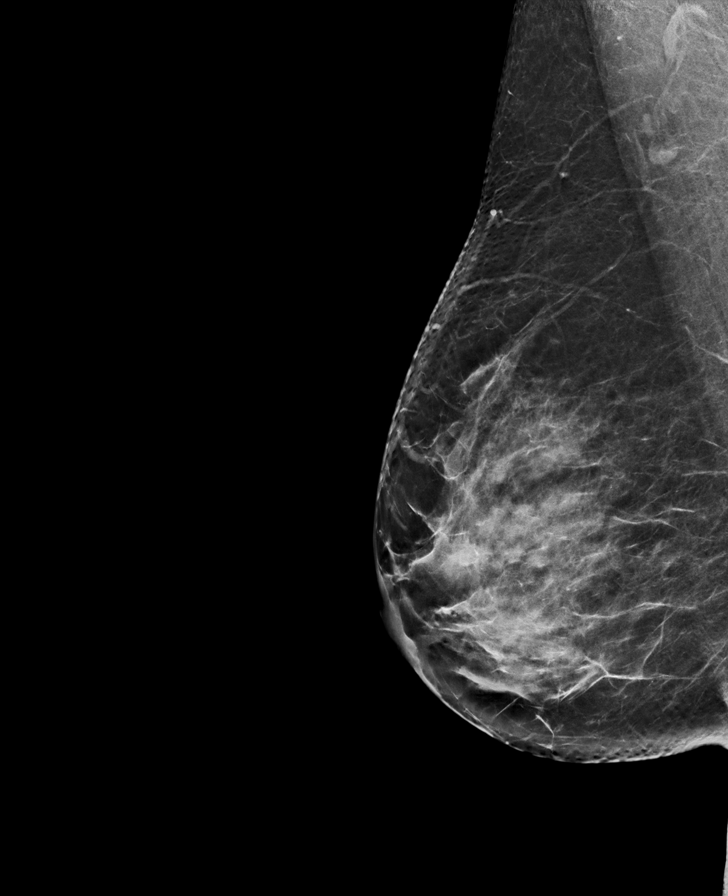

[R CC synth-2D]
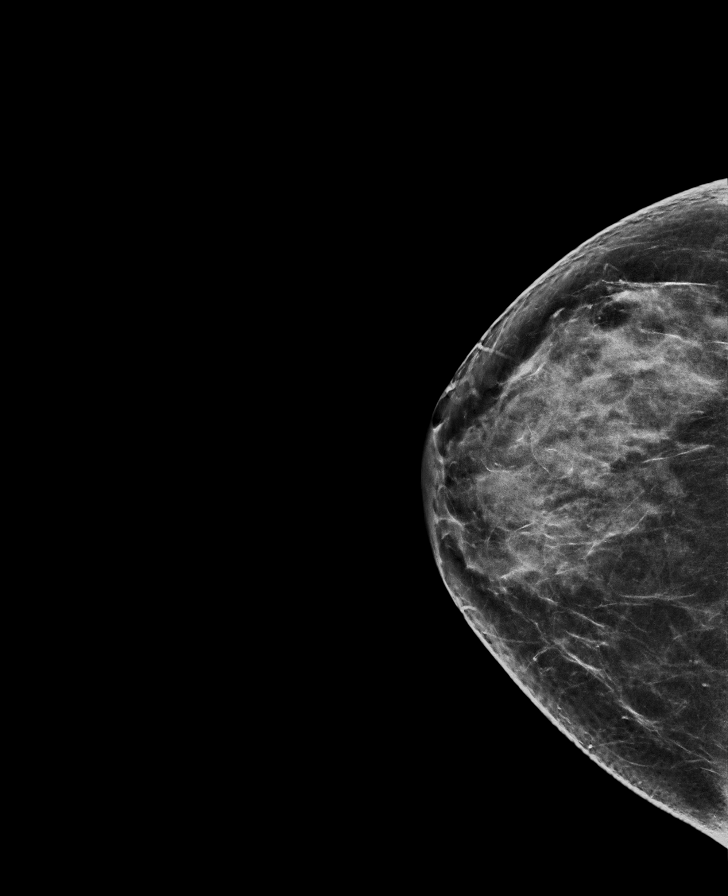

[L CC synth-2D]
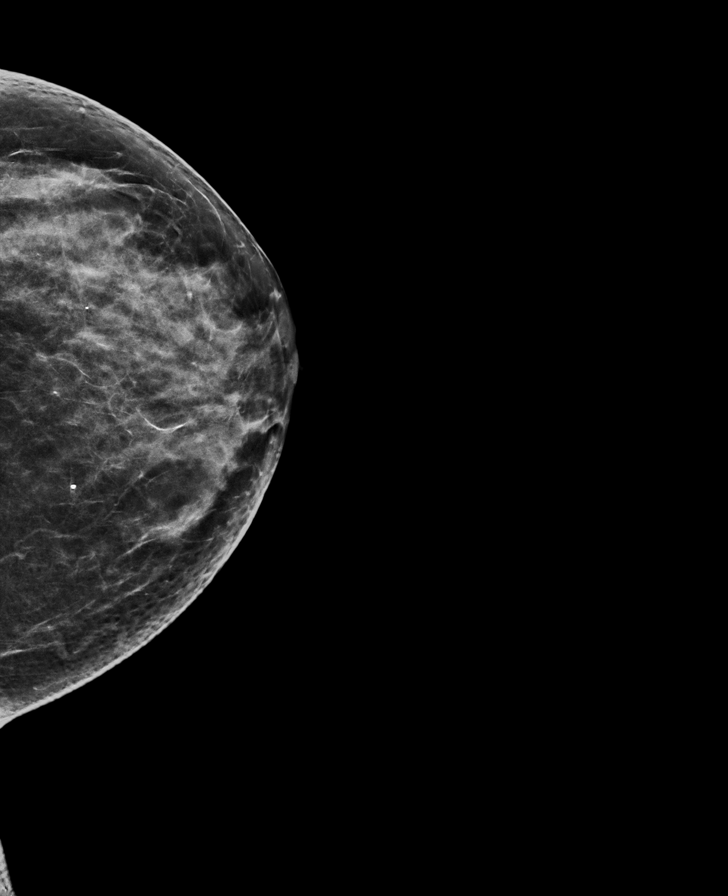

[L MLO synth-2D]
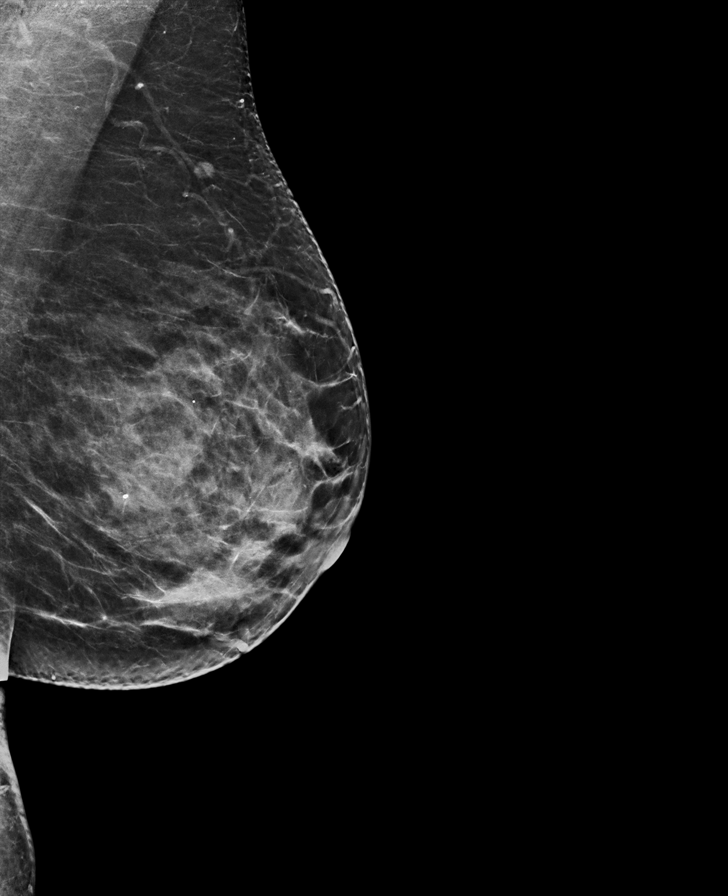

[L MLO tomo · tomo slice 39/76.0]
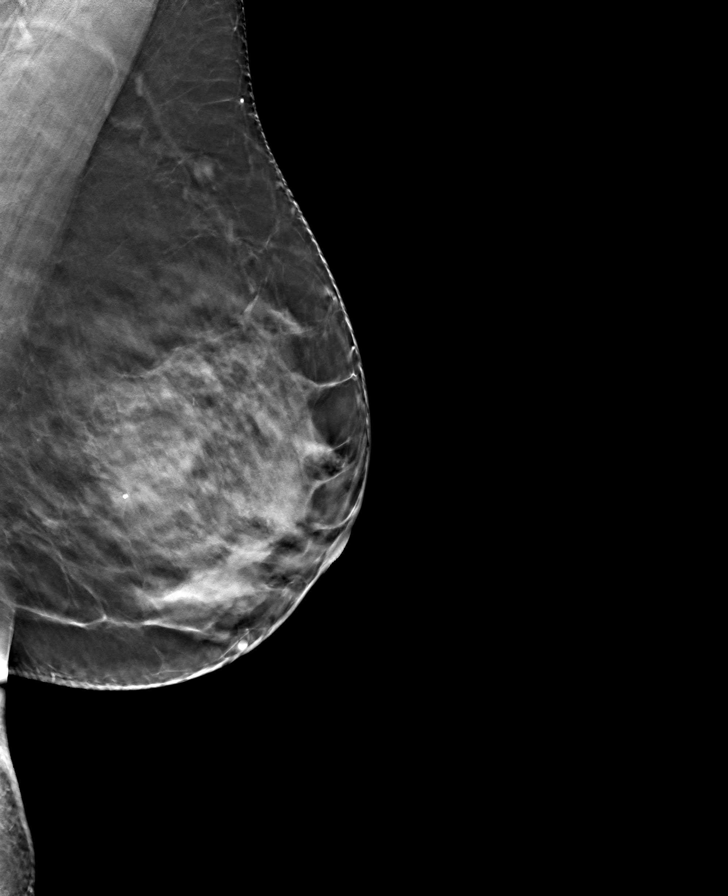

[R MLO tomo · tomo slice 40/79.0]
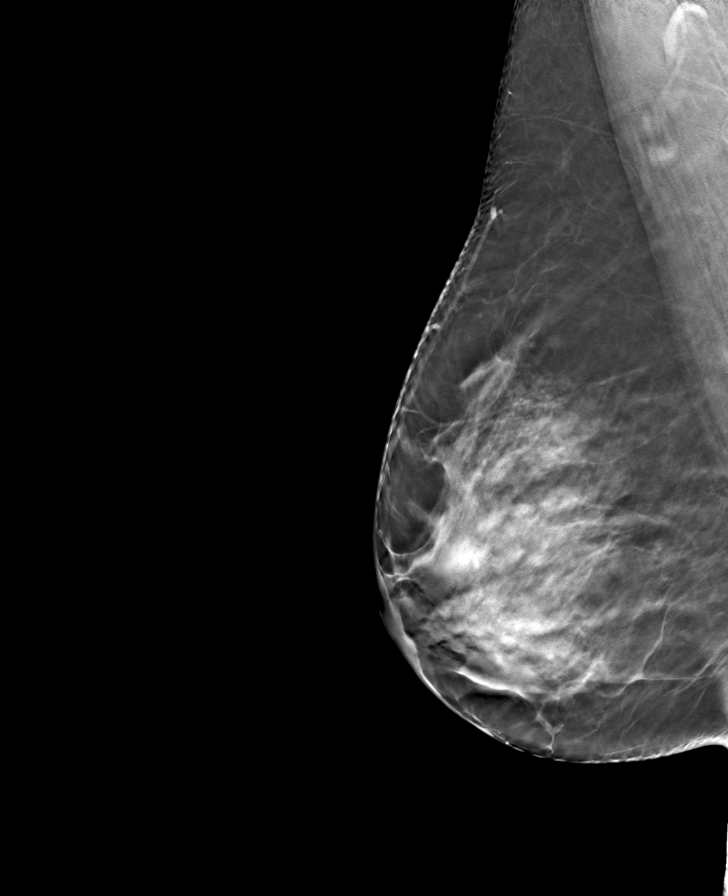

[L CC tomo · tomo slice 35/69.0]
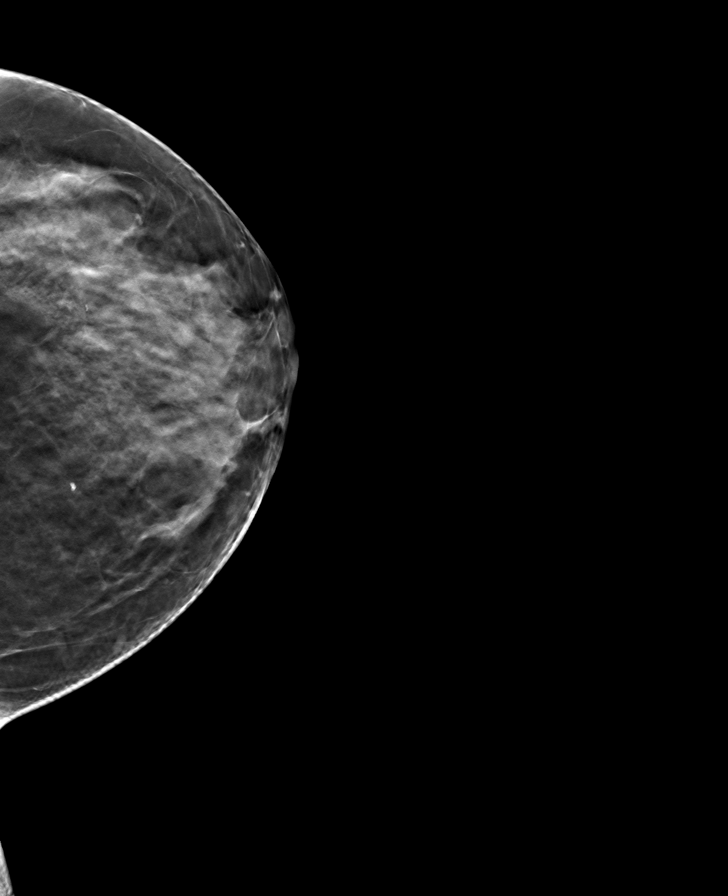

[R CC tomo · tomo slice 38/75.0]
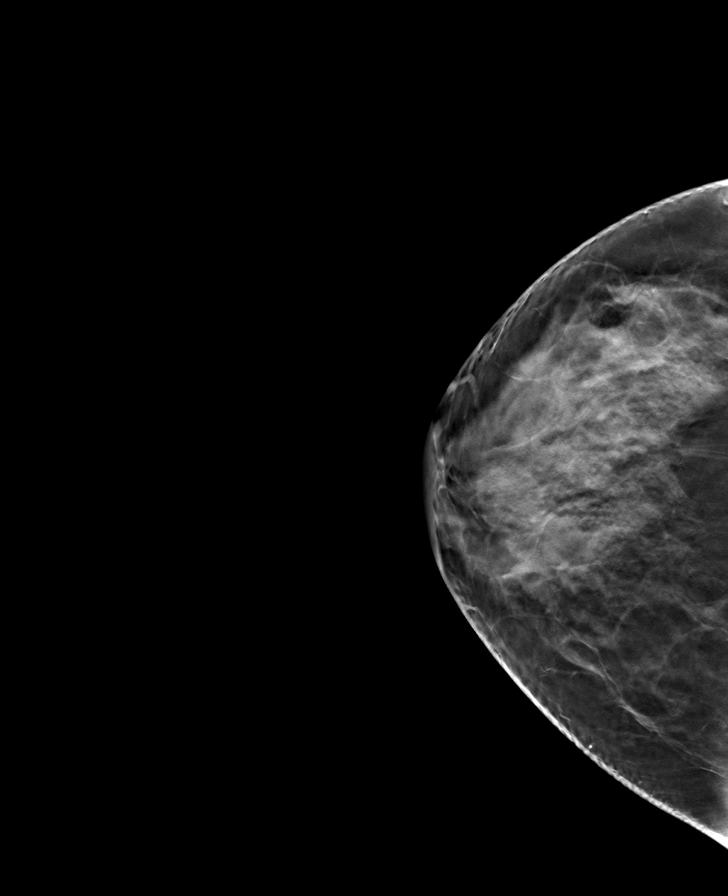

[8 of 24 positions shown; findings below may reference images not displayed]

ACR Breast Density Category c: The breast tissue is heterogeneously
dense, which may obscure small masses.
FINDINGS: There are no findings suspicious for malignancy.
IMPRESSION: No mammographic evidence of malignancy. A result letter of this
screening mammogram will be mailed directly to the patient.

RECOMMENDATION:
Screening mammogram in one year. (Code:[id number])

BI-RADS CATEGORY  1: Negative.

## 2023-11-26 ENCOUNTER — Encounter: Payer: Self-pay | Admitting: Gastroenterology

## 2023-11-29 ENCOUNTER — Ambulatory Visit
Admission: RE | Admit: 2023-11-29 | Discharge: 2023-11-29 | Disposition: A | Discharge status: Home / Self Care | Payer: Self-pay | Attending: Gastroenterology | Admitting: Gastroenterology

## 2023-11-29 ENCOUNTER — Encounter
Admission: RE | Disposition: A | Discharge status: Home / Self Care | Payer: Self-pay | Source: Home / Self Care | Attending: Gastroenterology

## 2023-11-29 ENCOUNTER — Encounter: Payer: Self-pay | Admitting: Gastroenterology

## 2023-11-29 ENCOUNTER — Ambulatory Visit: Payer: Self-pay | Admitting: Certified Registered Nurse Anesthetist

## 2023-11-29 DIAGNOSIS — Z1211 Encounter for screening for malignant neoplasm of colon: Secondary | ICD-10-CM | POA: Insufficient documentation

## 2023-11-29 HISTORY — DX: Anemia, unspecified: D64.9

## 2023-11-29 HISTORY — PX: COLONOSCOPY: SHX5424

## 2023-11-29 SURGERY — COLONOSCOPY
Anesthesia: General

## 2023-11-29 MED ORDER — SODIUM CHLORIDE 0.9 % IV SOLN
INTRAVENOUS | Status: DC
  Administered 2023-11-29: 500 mL via INTRAVENOUS

## 2023-11-29 MED ORDER — PROPOFOL 500 MG/50ML IV EMUL
INTRAVENOUS | Status: DC | PRN
  Administered 2023-11-29: 150 ug/kg/min via INTRAVENOUS

## 2023-11-29 MED ORDER — PROPOFOL 10 MG/ML IV BOLUS
INTRAVENOUS | Status: DC | PRN
  Administered 2023-11-29: 60 mg via INTRAVENOUS
  Administered 2023-11-29: 20 mg via INTRAVENOUS
  Administered 2023-11-29: 10 mg via INTRAVENOUS

## 2023-11-29 NOTE — Anesthesia Postprocedure Evaluation (Signed)
 Anesthesia Post Note  Patient: Tracy Wright  Procedure(s) Performed: COLONOSCOPY  Patient location during evaluation: PACU Anesthesia Type: General Level of consciousness: awake and alert Pain management: pain level controlled Vital Signs Assessment: post-procedure vital signs reviewed and stable Respiratory status: spontaneous breathing, nonlabored ventilation and respiratory function stable Cardiovascular status: blood pressure returned to baseline and stable Postop Assessment: no apparent nausea or vomiting Anesthetic complications: no   No notable events documented.   Last Vitals:  Vitals:   11/29/23 0923 11/29/23 0933  BP: (!) 117/96 (!) 152/99  Pulse: 64 62  Resp: 12 18  Temp:    SpO2: 99% 98%    Last Pain:  Vitals:   11/29/23 0933  TempSrc:   PainSc: 0-No pain                 Camellia Merilee Louder

## 2023-11-29 NOTE — Transfer of Care (Signed)
 Immediate Anesthesia Transfer of Care Note  Patient: Tracy Wright  Procedure(s) Performed: COLONOSCOPY  Patient Location: PACU  Anesthesia Type:General  Level of Consciousness: drowsy  Airway & Oxygen Therapy: Patient Spontanous Breathing and Patient connected to nasal cannula oxygen  Post-op Assessment: Report given to RN and Post -op Vital signs reviewed and stable  Post vital signs: Reviewed and stable  Last Vitals:  Vitals Value Taken Time  BP    Temp 35.6 C 11/29/23 09:13  Pulse 67 11/29/23 09:13  Resp 17 11/29/23 09:13  SpO2 99 % 11/29/23 09:13    Last Pain:  Vitals:   11/29/23 0913  TempSrc: Temporal  PainSc: 0-No pain         Complications: No notable events documented.

## 2023-11-29 NOTE — Op Note (Signed)
 [facility] Patient Name: Tracy Wright Procedure Date: 11/29/2023 8:32 AM MRN: [id number] Account #: [id number] Date of Birth: 1970/09/30 Admit Type: Outpatient Age: 53 Room: [facility] ENDO ROOM 1 Gender: Female Note Status: Finalized Instrument Name: Colon Scope [id number] Procedure:             Colonoscopy Indications:           Screening for colorectal malignant neoplasm Providers:             Ruel Kung MD, MD Medicines:             Monitored Anesthesia Care Complications:         No immediate complications. Procedure:             Pre-Anesthesia Assessment:                        - Prior to the procedure, a History and Physical was                         performed, and patient medications, allergies and                         sensitivities were reviewed. The patient's tolerance                         of previous anesthesia was reviewed.                        - The risks and benefits of the procedure and the                         sedation options and risks were discussed with the                         patient. All questions were answered and informed                         consent was obtained.                        - ASA Grade Assessment: II - A patient with mild                         systemic disease.                        After obtaining informed consent, the colonoscope was                         passed under direct vision. Throughout the procedure,                         the patient's blood pressure, pulse, and oxygen                         saturations were monitored continuously. The                         Colonoscope was introduced through the anus and  advanced to the the cecum, identified by the                         appendiceal orifice. The colonoscopy was performed                         with ease. The patient tolerated the procedure well.                         The quality of the bowel  preparation was excellent.                         The ileocecal valve, appendiceal orifice, and rectum                         were photographed. Findings:      The entire examined colon appeared normal on direct and retroflexion       views. Impression:            - The entire examined colon is normal on direct and                         retroflexion views.                        - No specimens collected. Recommendation:        - Discharge patient to home (with escort).                        - Resume previous diet.                        - Continue present medications.                        - Repeat colonoscopy in 10 years for screening                         purposes. Procedure Code(s):     --- Professional ---                        [phone], Colonoscopy, flexible; diagnostic, including                         collection of specimen(s) by brushing or washing, when                         performed (separate procedure) Diagnosis Code(s):     --- Professional ---                        Z12.11, Encounter for screening for malignant neoplasm                         of colon CPT copyright 2022 American Medical Association. All rights reserved. The codes documented in this report are preliminary and upon coder review may  be revised to meet current compliance requirements. Ruel Kung, MD Ruel Kung MD, MD 11/29/2023 9:11:41 AM This report has been signed electronically. Number of Addenda: 0 Note Initiated On: 11/29/2023 8:32 AM  Scope Withdrawal Time: 0 hours 7 minutes 56 seconds  Total Procedure Duration: 0 hours 10 minutes 11 seconds  Estimated Blood Loss:  Estimated blood loss: none.      [facility]

## 2023-11-29 NOTE — Anesthesia Preprocedure Evaluation (Addendum)
 Anesthesia Evaluation  Patient identified by MRN, date of birth, ID band Patient awake    Reviewed: Allergy & Precautions, H&P , NPO status , Patient's Chart, lab work & pertinent test results  Airway Mallampati: II  TM Distance: >3 FB Neck ROM: full    Dental no notable dental hx.    Pulmonary neg pulmonary ROS   Pulmonary exam normal        Cardiovascular negative cardio ROS Normal cardiovascular exam     Neuro/Psych negative neurological ROS  negative psych ROS   GI/Hepatic negative GI ROS, Neg liver ROS,,,  Endo/Other  negative endocrine ROS    Renal/GU negative Renal ROS  negative genitourinary   Musculoskeletal   Abdominal   Peds  Hematology negative hematology ROS (+)   Anesthesia Other Findings Past Medical History: No date: Anemia  BMI    Body Mass Index: 28.36 kg/m      Reproductive/Obstetrics negative OB ROS                              Anesthesia Physical Anesthesia Plan  ASA: 2  Anesthesia Plan: General   Post-op Pain Management: Minimal or no pain anticipated   Induction:   PONV Risk Score and Plan: Propofol  infusion and TIVA  Airway Management Planned: Natural Airway  Additional Equipment:   Intra-op Plan:   Post-operative Plan:   Informed Consent: I have reviewed the patients History and Physical, chart, labs and discussed the procedure including the risks, benefits and alternatives for the proposed anesthesia with the patient or authorized representative who has indicated his/her understanding and acceptance.     Dental Advisory Given  Plan Discussed with: CRNA and Surgeon  Anesthesia Plan Comments:          Anesthesia Quick Evaluation

## 2023-11-29 NOTE — H&P (Signed)
 Ruel Kung , MD [address], [place], [place], [address] Phone: [phone] Fax: [phone]  Primary Care Physician:  Diedra Lame, MD   Pre-Procedure History & Physical: HPI:  Alicianna Litchford is a 53 y.o. female is here for an colonoscopy.   Past Medical History:  Diagnosis Date   Anemia     No past surgical history on file.  Prior to Admission medications   Medication Sig Start Date End Date Taking? Authorizing Provider  ciprofloxacin  (CIPRO ) 500 MG tablet Take 1 tablet (500 mg total) by mouth 2 (two) times daily. 06/01/20   Benjamin, Lunise, NP  estradiol (ESTRACE) 1 MG tablet Take by mouth. 11/30/19   [provider]  naproxen  (NAPROSYN ) 500 MG tablet Take 1 tablet (500 mg total) by mouth 2 (two) times daily. 01/14/22   Brimage, Vondra, DO  prochlorperazine  (COMPAZINE ) 10 MG tablet Take 1 tablet (10 mg total) by mouth every 6 (six) hours as needed for nausea or vomiting (Headache). 09/10/21   Willo Dunnings, MD  tamsulosin  (FLOMAX ) 0.4 MG CAPS capsule Take 1 capsule (0.4 mg total) by mouth daily. 06/01/20   Morene Morrow, NP    Allergies as of 10/25/2023   (No Known Allergies)    Family History  Problem Relation Age of Onset   Diabetes Mother    Cancer Father    Diabetes Father    Breast cancer Neg Hx     Social History   Socioeconomic History   Marital status: Married    Spouse name: Not on file   Number of children: Not on file   Years of education: Not on file   Highest education level: Not on file  Occupational History   Not on file  Tobacco Use   Smoking status: Never   Smokeless tobacco: Never  Vaping Use   Vaping status: Never Used  Substance and Sexual Activity   Alcohol use: Never   Drug use: Never   Sexual activity: Not on file  Other Topics Concern   Not on file  Social History Narrative    Not on file   Social Drivers of Health   Financial Resource Strain: Patient Declined (06/17/2023)   Received from [facility]   Overall Financial Resource Strain (CARDIA)    Difficulty of Paying Living Expenses: Patient declined  Food Insecurity: Patient Declined (06/17/2023)   Received from [facility]   Hunger Vital Sign    Within the past 12 months, you worried that your food would run out before you got the money to buy more.: Patient declined    Within the past 12 months, the food you bought just didn't last and you didn't have money to get more.: Patient declined  Transportation Needs: Patient Declined (06/17/2023)   Received from [facility] - Transportation    In the past 12 months, has lack of transportation kept you from medical appointments or from getting medications?: Patient declined    Lack of Transportation (Non-Medical): Patient declined  Physical Activity: Not on file  Stress: Not on file  Social Connections: Not on file  Intimate Partner Violence: Not on file    Review of Systems: See HPI, otherwise negative ROS  Physical Exam:  General:   Alert,  pleasant and cooperative in NAD Head:  Normocephalic and atraumatic. Neck:  Supple; no masses or thyromegaly. Lungs:  Clear throughout to auscultation, normal respiratory effort.    Heart:  +S1, +S2, Regular  rate and rhythm, No edema. Abdomen:  Soft, nontender and nondistended. Normal bowel sounds, without guarding, and without rebound.   Neurologic:  Alert and  oriented x4;  grossly normal neurologically.  Impression/Plan: Cleatus Goodin is here for an colonoscopy to be performed for Screening colonoscopy average risk   Risks, benefits, limitations, and alternatives regarding  colonoscopy have been reviewed with the patient.  Questions have been answered.  All parties agreeable.   Ruel Kung, MD  11/29/2023, 8:37 AM

## 2023-11-30 LAB — POCT PREGNANCY, URINE: Preg Test, Ur: NEGATIVE
